# Patient Record
Sex: Male | Born: 1995 | Race: White | Hispanic: No | Marital: Single | Smoking: Current every day smoker
Health system: Southern US, Community
[De-identification: ages and names within clinical notes are randomized; demographics above are authoritative.]

## PROBLEM LIST (undated history)

## (undated) DIAGNOSIS — Z720 Tobacco use: Secondary | ICD-10-CM

## (undated) DIAGNOSIS — J45909 Unspecified asthma, uncomplicated: Secondary | ICD-10-CM

## (undated) DIAGNOSIS — F1290 Cannabis use, unspecified, uncomplicated: Secondary | ICD-10-CM

---
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
## 2015-08-25 ENCOUNTER — Emergency Department (HOSPITAL_COMMUNITY): Payer: Medicaid Other

## 2015-08-25 ENCOUNTER — Observation Stay (HOSPITAL_COMMUNITY)
Admission: EM | Admit: 2015-08-25 | Discharge: 2015-08-26 | Disposition: A | Discharge status: Home / Self Care | Payer: Medicaid Other | Attending: Emergency Medicine | Admitting: Emergency Medicine

## 2015-08-25 ENCOUNTER — Encounter (HOSPITAL_COMMUNITY): Payer: Self-pay | Admitting: Emergency Medicine

## 2015-08-25 DIAGNOSIS — A419 Sepsis, unspecified organism: Secondary | ICD-10-CM | POA: Diagnosis not present

## 2015-08-25 DIAGNOSIS — J209 Acute bronchitis, unspecified: Secondary | ICD-10-CM | POA: Diagnosis present

## 2015-08-25 DIAGNOSIS — J45901 Unspecified asthma with (acute) exacerbation: Secondary | ICD-10-CM | POA: Diagnosis not present

## 2015-08-25 DIAGNOSIS — R509 Fever, unspecified: Secondary | ICD-10-CM | POA: Diagnosis present

## 2015-08-25 DIAGNOSIS — R000 Tachycardia, unspecified: Secondary | ICD-10-CM | POA: Diagnosis not present

## 2015-08-25 DIAGNOSIS — F1290 Cannabis use, unspecified, uncomplicated: Secondary | ICD-10-CM | POA: Diagnosis present

## 2015-08-25 DIAGNOSIS — F17210 Nicotine dependence, cigarettes, uncomplicated: Secondary | ICD-10-CM | POA: Insufficient documentation

## 2015-08-25 DIAGNOSIS — R0789 Other chest pain: Secondary | ICD-10-CM | POA: Insufficient documentation

## 2015-08-25 DIAGNOSIS — Z720 Tobacco use: Secondary | ICD-10-CM | POA: Diagnosis present

## 2015-08-25 DIAGNOSIS — R062 Wheezing: Secondary | ICD-10-CM

## 2015-08-25 HISTORY — DX: Unspecified asthma, uncomplicated: J45.909

## 2015-08-25 HISTORY — DX: Tobacco use: Z72.0

## 2015-08-25 HISTORY — DX: Cannabis use, unspecified, uncomplicated: F12.90

## 2015-08-25 LAB — I-STAT CG4 LACTIC ACID, ED: Lactic Acid, Venous: 1.53 mmol/L (ref 0.5–2.0)

## 2015-08-25 LAB — I-STAT TROPONIN, ED: Troponin i, poc: 0.05 ng/mL (ref 0.00–0.08)

## 2015-08-25 LAB — CBC WITH DIFFERENTIAL/PLATELET
BASOS ABS: 0 10*3/uL (ref 0.0–0.1)
BASOS PCT: 0 %
Eosinophils Absolute: 0 10*3/uL (ref 0.0–0.7)
Eosinophils Relative: 0 %
HEMATOCRIT: 45.3 % (ref 39.0–52.0)
HEMOGLOBIN: 15.5 g/dL (ref 13.0–17.0)
Lymphocytes Relative: 7 %
Lymphs Abs: 1.4 10*3/uL (ref 0.7–4.0)
MCH: 31.9 pg (ref 26.0–34.0)
MCHC: 34.2 g/dL (ref 30.0–36.0)
MCV: 93.2 fL (ref 78.0–100.0)
MONOS PCT: 12 %
Monocytes Absolute: 2.4 10*3/uL — ABNORMAL HIGH (ref 0.1–1.0)
NEUTROS ABS: 16.3 10*3/uL — AB (ref 1.7–7.7)
Neutrophils Relative %: 81 %
PLATELETS: 219 10*3/uL (ref 150–400)
RBC: 4.86 MIL/uL (ref 4.22–5.81)
RDW: 12.5 % (ref 11.5–15.5)
WBC: 20.2 10*3/uL — ABNORMAL HIGH (ref 4.0–10.5)

## 2015-08-25 MED ORDER — SODIUM CHLORIDE 0.9 % IV BOLUS (SEPSIS)
500.0000 mL | Freq: Once | INTRAVENOUS | Status: AC
  Administered 2015-08-26: 500 mL via INTRAVENOUS

## 2015-08-25 MED ORDER — SODIUM CHLORIDE 0.9 % IV BOLUS (SEPSIS)
250.0000 mL | Freq: Once | INTRAVENOUS | Status: AC
  Administered 2015-08-26: 250 mL via INTRAVENOUS

## 2015-08-25 MED ORDER — SODIUM CHLORIDE 0.9 % IV BOLUS (SEPSIS)
1000.0000 mL | Freq: Once | INTRAVENOUS | Status: AC
  Administered 2015-08-25: 1000 mL via INTRAVENOUS

## 2015-08-25 MED ORDER — ACETAMINOPHEN 325 MG PO TABS
650.0000 mg | ORAL_TABLET | Freq: Once | ORAL | Status: AC | PRN
  Administered 2015-08-25: 650 mg via ORAL
  Filled 2015-08-25: qty 2

## 2015-08-25 NOTE — ED Notes (Signed)
C/o pain to center of chest, productive cough with green sputum, and fever since Monday.

## 2015-08-25 NOTE — ED Provider Notes (Signed)
CSN: [id number]     Arrival date & time 08/25/15  2228 History   First MD Initiated Contact with Patient 08/25/15 2250     Chief Complaint  Patient presents with  . Code Sepsis     (Consider location/radiation/quality/duration/timing/severity/associated sxs/prior Treatment) The history is provided by the patient and medical records. No language interpreter was used.     Brendan Fields is a 20 y.o. male  with a hx of Childhood asthma presents to the Emergency Department complaining of gradual, persistent, progressively worsening cough active of green sputum, shortness of breath, central chest pain onset days ago. Patient reports he developed fever today.  He reports he smokes one block and mild cigar per day. Admits to intermittent alcohol use and marijuana several times per week but denies other drug usage. Patient specifically denies IV drug usage. He denies sick contacts. Denies recent travel. Did not receive an Influenza vaccine this year. No treatments prior to arrival. Nothing makes the symptoms better or worse.  Patient reports associated nausea without vomiting. He denies headache, neck pain, neck stiffness, abdominal pain, numbing, diarrhea, syncope, dysuria, hematuria, rash.  Past Medical History  Diagnosis Date  . Tobacco abuse   . Marijuana use   . Childhood asthma    History reviewed. No pertinent past surgical history. No family history on file. Social History  Substance Use Topics  . Smoking status: Current Every Day Smoker    Types: Cigars  . Smokeless tobacco: None  . Alcohol Use: Yes    Review of Systems  Constitutional: Positive for fever and fatigue. Negative for diaphoresis, appetite change and unexpected weight change.  HENT: Negative for mouth sores.   Eyes: Negative for visual disturbance.  Respiratory: Positive for cough, chest tightness, shortness of breath and wheezing.   Cardiovascular: Negative for chest pain.  Gastrointestinal: Negative for nausea,  vomiting, abdominal pain, diarrhea and constipation.  Endocrine: Negative for polydipsia, polyphagia and polyuria.  Genitourinary: Negative for dysuria, urgency, frequency and hematuria.  Musculoskeletal: Negative for back pain and neck stiffness.  Skin: Negative for rash.  Allergic/Immunologic: Negative for immunocompromised state.  Neurological: Positive for weakness ( generalized). Negative for syncope, light-headedness and headaches.  Hematological: Does not bruise/bleed easily.  Psychiatric/Behavioral: Negative for sleep disturbance. The patient is not nervous/anxious.       Allergies  Review of patient's allergies indicates no known allergies.  Home Medications   Prior to Admission medications   Medication Sig Start Date End Date Taking? Authorizing Provider  azithromycin (ZITHROMAX) 250 MG tablet 1 tab po qd x 4 more days Patient not taking: Reported on 03/29/2014 08/11/11   Viviano Simas, NP  naproxen (NAPROSYN) 250 MG tablet Take 1 tablet (250 mg total) by mouth 2 (two) times daily with a meal. Patient not taking: Reported on 08/25/2015 08/29/14   [name], PA-C  predniSONE (DELTASONE) 20 MG tablet Take 2 tablets (40 mg total) by mouth daily. Patient not taking: Reported on 08/25/2015 08/29/14   [name], PA-C   BP 115/72 mmHg  Pulse 117  Temp(Src) 103.1 F (39.5 C) (Oral)  Resp 16  Ht  (1.803 m)  Wt 55.792 kg  BMI 17.16 kg/m2  SpO2 97% Physical Exam  Constitutional: He is oriented to person, place, and time. He appears well-developed and well-nourished. No distress.  Awake, alert, nontoxic appearance  HENT:  Head: Normocephalic and atraumatic.  Right Ear: Tympanic membrane, external ear and ear canal normal.  Left Ear: Tympanic membrane, external ear and ear  canal normal.  Nose: Nose normal. No epistaxis. Right sinus exhibits no maxillary sinus tenderness and no frontal sinus tenderness. Left sinus exhibits no maxillary sinus tenderness and no frontal  sinus tenderness.  Mouth/Throat: Uvula is midline, oropharynx is clear and moist and mucous membranes are normal. Mucous membranes are not pale and not cyanotic. No oropharyngeal exudate, posterior oropharyngeal edema, posterior oropharyngeal erythema or tonsillar abscesses.  Eyes: Conjunctivae are normal. Pupils are equal, round, and reactive to light. No scleral icterus.  Neck: Normal range of motion and full passive range of motion without pain. Neck supple.  Cardiovascular: Regular rhythm, normal heart sounds and intact distal pulses.  Tachycardia present.   No murmur heard. Pulses:      Radial pulses are 2+ on the right side, and 2+ on the left side.       Dorsalis pedis pulses are 2+ on the right side, and 2+ on the left side.  Capillary refill less than 3 seconds  Pulmonary/Chest: Effort normal and breath sounds normal. No stridor. No respiratory distress. He has no wheezes.  Equal chest expansion Diminished breath sounds with wheezes throughout  Abdominal: Soft. Bowel sounds are normal. He exhibits no mass. There is no tenderness. There is no rebound and no guarding.  Musculoskeletal: Normal range of motion. He exhibits no edema.  Lymphadenopathy:    He has no cervical adenopathy.  Neurological: He is alert and oriented to person, place, and time.  Speech is clear and goal oriented Moves extremities without ataxia  Skin: Skin is warm and dry. No rash noted. He is not diaphoretic.  No track marks on arms or legs Hot to touch No rash or signs of cellulitis  Psychiatric: He has a normal mood and affect.  Nursing note and vitals reviewed.   ED Course  Procedures (including critical care time) Labs Review Labs Reviewed  COMPREHENSIVE METABOLIC PANEL - Abnormal; Notable for the following:    CO2 18 (*)    Glucose, Bld 107 (*)    ALT 15 (*)    Total Bilirubin 1.5 (*)    All other components within normal limits  CBC WITH DIFFERENTIAL/PLATELET - Abnormal; Notable for the  following:    WBC 20.2 (*)    Neutro Abs 16.3 (*)    Monocytes Absolute 2.4 (*)    All other components within normal limits  URINALYSIS, ROUTINE W REFLEX MICROSCOPIC (NOT AT [facility]) - Abnormal; Notable for the following:    Color, Urine AMBER (*)    pH 8.5 (*)    Ketones, ur >80 (*)    Protein, ur 100 (*)    All other components within normal limits  URINE MICROSCOPIC-ADD ON - Abnormal; Notable for the following:    Squamous Epithelial / LPF 0-5 (*)    Bacteria, UA RARE (*)    All other components within normal limits  I-STAT ARTERIAL BLOOD GAS, ED - Abnormal; Notable for the following:    pH, Arterial 7.345 (*)    pCO2 arterial 32.9 (*)    pO2, Arterial 78.0 (*)    Bicarbonate 17.5 (*)    Acid-base deficit 6.0 (*)    All other components within normal limits  CULTURE, BLOOD (ROUTINE X 2)  CULTURE, BLOOD (ROUTINE X 2)  URINE CULTURE  INFLUENZA PANEL BY PCR (TYPE A & B, H1N1)  I-STAT CG4 LACTIC ACID, ED  I-STAT TROPOININ, ED  I-STAT CG4 LACTIC ACID, ED    Imaging Review Dg Chest 2 View  08/25/2015  CLINICAL DATA:  Cough and fever for 1 week.  Midchest pain tonight. EXAM: CHEST  2 VIEW COMPARISON:  08/29/2014 FINDINGS: The lungs are clear. The pulmonary vasculature is normal. Heart size is normal. Hilar and mediastinal contours are unremarkable. There is no pleural effusion. IMPRESSION: No active cardiopulmonary disease. Electronically Signed   By: Ellery Plunk M.D.   On: 08/25/2015 23:18   I have personally reviewed and evaluated these images and lab results as part of my medical decision-making.   EKG Interpretation   Date/Time:  Sunday Aug 25 2015 22:34:54 EDT Ventricular Rate:  128 PR Interval:  120 QRS Duration: 82 QT Interval:  300 QTC Calculation: 438 R Axis:   -132 Text Interpretation:  Sinus tachycardia Right atrial enlargement Right  superior axis deviation Pulmonary disease pattern Abnormal ECG Confirmed  by RAY MD, Duwayne Heck ([id number]) on 08/26/2015 12:13:48  AM    CRITICAL CARE Performed by: Dierdre Forth Total critical care time: 35 minutes Critical care time was exclusive of separately billable procedures and treating other patients. Critical care was necessary to treat or prevent imminent or life-threatening deterioration. Critical care was time spent personally by me on the following activities: development of treatment plan with patient and/or surrogate as well as nursing, discussions with consultants, evaluation of patient's response to treatment, examination of patient, obtaining history from patient or surrogate, ordering and performing treatments and interventions, ordering and review of laboratory studies, ordering and review of radiographic studies, pulse oximetry and re-evaluation of patient's condition.  Sepsis - Repeat Assessment  Performed at:    08/26/2015 3:45 AM  Vitals     Blood pressure 115/72, pulse 117, temperature 103.1 F (39.5 C), temperature source Oral, resp. rate 16, height 5\' 11"  (1.803 m), weight 55.792 kg, SpO2 97 %.  Heart:     Tachycardic  Lungs:    Wheezing and Rhonchi  Capillary Refill:   <2 sec  Peripheral Pulse:   Radial pulse palpable  Skin:     Normal Color   MDM   Final diagnoses:  Sepsis, due to unspecified organism (HCC)  Wheezing  Fever, unspecified fever cause   Brendan Fields presents with Fever, tachycardia, mild hypoxia. Reports one week of chest pain shortness of breath. On exam wheezing which improved some after albuterol treatment. Patient meets sepsis criteria.  Fluids and antibiotics initiated. No evidence of urinary tract infection.  No infiltrate on chest x-ray however symptoms are consistent with pneumonia. Patient treated for same. CBC was significant leukocytosis of 20.2.  Normal lactic acid. Arterial blood gas with mild acidosis and some hypoxia.  Patient remains tachycardic even after fluid bolus however he is without hypotension. He is not requiring supplemental  oxygen.  Influenza panel pending. Will admit for further workup.  3:47 AM Discussed with Dr. Clyde Lundborg who will admit to Tele Obs.     The patient was discussed with and seen by Dr. Blinda Leatherwood who agrees with the treatment plan.    Brendan Client Jannette Cotham, PA-C 08/26/15 8119  Gilda Crease, MD 08/26/15 [phone]

## 2015-08-26 ENCOUNTER — Encounter (HOSPITAL_COMMUNITY): Payer: Self-pay | Admitting: Internal Medicine

## 2015-08-26 ENCOUNTER — Telehealth: Payer: Self-pay

## 2015-08-26 DIAGNOSIS — J209 Acute bronchitis, unspecified: Secondary | ICD-10-CM

## 2015-08-26 DIAGNOSIS — A419 Sepsis, unspecified organism: Secondary | ICD-10-CM | POA: Diagnosis present

## 2015-08-26 DIAGNOSIS — F1290 Cannabis use, unspecified, uncomplicated: Secondary | ICD-10-CM | POA: Diagnosis present

## 2015-08-26 DIAGNOSIS — Z720 Tobacco use: Secondary | ICD-10-CM | POA: Diagnosis present

## 2015-08-26 DIAGNOSIS — F1210 Cannabis abuse, uncomplicated: Secondary | ICD-10-CM

## 2015-08-26 LAB — COMPREHENSIVE METABOLIC PANEL
ALBUMIN: 4.5 g/dL (ref 3.5–5.0)
ALT: 15 U/L — AB (ref 17–63)
ANION GAP: 14 (ref 5–15)
AST: 22 U/L (ref 15–41)
Alkaline Phosphatase: 79 U/L (ref 38–126)
BUN: 7 mg/dL (ref 6–20)
CHLORIDE: 104 mmol/L (ref 101–111)
CO2: 18 mmol/L — AB (ref 22–32)
CREATININE: 1.16 mg/dL (ref 0.61–1.24)
Calcium: 9.2 mg/dL (ref 8.9–10.3)
GFR calc non Af Amer: >60 mL/min (ref >60)
Glucose, Bld: 107 mg/dL — ABNORMAL HIGH (ref 65–99)
Potassium: 3.9 mmol/L (ref 3.5–5.1)
SODIUM: 136 mmol/L (ref 135–145)
Total Bilirubin: 1.5 mg/dL — ABNORMAL HIGH (ref 0.3–1.2)
Total Protein: 7.6 g/dL (ref 6.5–8.1)

## 2015-08-26 LAB — INFLUENZA PANEL BY PCR (TYPE A & B)
H1N1FLUPCR: NOT DETECTED
INFLAPCR: NEGATIVE
Influenza B By PCR: NEGATIVE

## 2015-08-26 LAB — I-STAT ARTERIAL BLOOD GAS, ED
ACID-BASE DEFICIT: 6 mmol/L — AB (ref 0.0–2.0)
Bicarbonate: 17.5 mEq/L — ABNORMAL LOW (ref 20.0–24.0)
O2 SAT: 93 %
PH ART: 7.345 — AB (ref 7.350–7.450)
Patient temperature: 103.1
TCO2: 18 mmol/L (ref 0–100)
pCO2 arterial: 32.9 mmHg — ABNORMAL LOW (ref 35.0–45.0)
pO2, Arterial: 78 mmHg — ABNORMAL LOW (ref 80.0–100.0)

## 2015-08-26 LAB — RAPID URINE DRUG SCREEN, HOSP PERFORMED
Amphetamines: NOT DETECTED
BENZODIAZEPINES: NOT DETECTED
Barbiturates: NOT DETECTED
COCAINE: POSITIVE — AB
Opiates: NOT DETECTED
Tetrahydrocannabinol: POSITIVE — AB

## 2015-08-26 LAB — RESPIRATORY PANEL BY PCR
ADENOVIRUS-RVPPCR: NOT DETECTED
Bordetella pertussis: NOT DETECTED
CHLAMYDOPHILA PNEUMONIAE-RVPPCR: NOT DETECTED
CORONAVIRUS 229E-RVPPCR: NOT DETECTED
Coronavirus HKU1: NOT DETECTED
Coronavirus NL63: NOT DETECTED
Coronavirus OC43: NOT DETECTED
INFLUENZA A H1-RVPPCR: NOT DETECTED
INFLUENZA A-RVPPCR: NOT DETECTED
Influenza A H1 2009: NOT DETECTED
Influenza A H3: NOT DETECTED
Influenza B: NOT DETECTED
Metapneumovirus: NOT DETECTED
Mycoplasma pneumoniae: NOT DETECTED
PARAINFLUENZA VIRUS 4-RVPPCR: NOT DETECTED
Parainfluenza Virus 1: NOT DETECTED
Parainfluenza Virus 2: NOT DETECTED
Parainfluenza Virus 3: NOT DETECTED
RESPIRATORY SYNCYTIAL VIRUS-RVPPCR: NOT DETECTED
Rhinovirus / Enterovirus: DETECTED — AB

## 2015-08-26 LAB — URINALYSIS, ROUTINE W REFLEX MICROSCOPIC
BILIRUBIN URINE: NEGATIVE
GLUCOSE, UA: NEGATIVE mg/dL
Hgb urine dipstick: NEGATIVE
LEUKOCYTES UA: NEGATIVE
NITRITE: NEGATIVE
PROTEIN: 100 mg/dL — AB
Specific Gravity, Urine: 1.028 (ref 1.005–1.030)
pH: 8.5 — ABNORMAL HIGH (ref 5.0–8.0)

## 2015-08-26 LAB — I-STAT CG4 LACTIC ACID, ED: LACTIC ACID, VENOUS: 1.01 mmol/L (ref 0.5–2.0)

## 2015-08-26 LAB — HIV ANTIBODY (ROUTINE TESTING W REFLEX): HIV SCREEN 4TH GENERATION: NONREACTIVE

## 2015-08-26 LAB — STREP PNEUMONIAE URINARY ANTIGEN: Strep Pneumo Urinary Antigen: NEGATIVE

## 2015-08-26 LAB — URINE MICROSCOPIC-ADD ON

## 2015-08-26 MED ORDER — IPRATROPIUM BROMIDE 0.02 % IN SOLN
0.5000 mg | Freq: Two times a day (BID) | RESPIRATORY_TRACT | Status: DC
  Administered 2015-08-26: 0.5 mg via RESPIRATORY_TRACT
  Filled 2015-08-26 (×2): qty 2.5

## 2015-08-26 MED ORDER — ALBUTEROL SULFATE (2.5 MG/3ML) 0.083% IN NEBU
5.0000 mg | INHALATION_SOLUTION | Freq: Once | RESPIRATORY_TRACT | Status: AC
  Administered 2015-08-26: 5 mg via RESPIRATORY_TRACT
  Filled 2015-08-26: qty 6

## 2015-08-26 MED ORDER — DM-GUAIFENESIN ER 30-600 MG PO TB12
1.0000 | ORAL_TABLET | Freq: Two times a day (BID) | ORAL | Status: DC
  Administered 2015-08-26: 1 via ORAL
  Filled 2015-08-26: qty 1

## 2015-08-26 MED ORDER — NICOTINE 21 MG/24HR TD PT24
21.0000 mg | MEDICATED_PATCH | Freq: Every day | TRANSDERMAL | Status: DC
  Filled 2015-08-26: qty 1

## 2015-08-26 MED ORDER — ALBUTEROL SULFATE HFA 108 (90 BASE) MCG/ACT IN AERS: 1.0000 | INHALATION_SPRAY | RESPIRATORY_TRACT | Status: DC | PRN

## 2015-08-26 MED ORDER — LEVALBUTEROL HCL 1.25 MG/0.5ML IN NEBU
1.2500 mg | INHALATION_SOLUTION | Freq: Two times a day (BID) | RESPIRATORY_TRACT | Status: DC
  Administered 2015-08-26: 1.25 mg via RESPIRATORY_TRACT
  Filled 2015-08-26 (×2): qty 0.5

## 2015-08-26 MED ORDER — ENOXAPARIN SODIUM 40 MG/0.4ML ~~LOC~~ SOLN: 40.0000 mg | SUBCUTANEOUS | Status: DC

## 2015-08-26 MED ORDER — DEXTROSE 5 % IV SOLN
500.0000 mg | INTRAVENOUS | Status: DC
  Filled 2015-08-26: qty 500

## 2015-08-26 MED ORDER — ALBUTEROL SULFATE (2.5 MG/3ML) 0.083% IN NEBU: 2.5000 mg | INHALATION_SOLUTION | RESPIRATORY_TRACT | Status: DC | PRN

## 2015-08-26 MED ORDER — CEFTRIAXONE SODIUM 1 G IJ SOLR
1.0000 g | Freq: Once | INTRAMUSCULAR | Status: AC
  Administered 2015-08-26: 1 g via INTRAVENOUS
  Filled 2015-08-26: qty 10

## 2015-08-26 MED ORDER — ENOXAPARIN SODIUM 40 MG/0.4ML ~~LOC~~ SOLN
40.0000 mg | SUBCUTANEOUS | Status: DC
  Filled 2015-08-26: qty 0.4

## 2015-08-26 MED ORDER — SODIUM CHLORIDE 0.9 % IV SOLN
INTRAVENOUS | Status: DC
  Administered 2015-08-26: 07:00:00 via INTRAVENOUS

## 2015-08-26 MED ORDER — AZITHROMYCIN 500 MG IV SOLR
500.0000 mg | Freq: Once | INTRAVENOUS | Status: AC
  Administered 2015-08-26: 500 mg via INTRAVENOUS
  Filled 2015-08-26: qty 500

## 2015-08-26 MED ORDER — IPRATROPIUM BROMIDE 0.02 % IN SOLN
0.5000 mg | Freq: Once | RESPIRATORY_TRACT | Status: AC
  Administered 2015-08-26: 0.5 mg via RESPIRATORY_TRACT
  Filled 2015-08-26: qty 2.5

## 2015-08-26 MED ORDER — LEVALBUTEROL HCL 1.25 MG/0.5ML IN NEBU: 1.2500 mg | INHALATION_SOLUTION | RESPIRATORY_TRACT | Status: DC | PRN

## 2015-08-26 MED ORDER — IPRATROPIUM BROMIDE 0.02 % IN SOLN
0.5000 mg | RESPIRATORY_TRACT | Status: DC
  Filled 2015-08-26: qty 2.5

## 2015-08-26 MED ORDER — PREDNISONE 20 MG PO TABS: 40.0000 mg | ORAL_TABLET | Freq: Every day | ORAL | Status: AC

## 2015-08-26 MED ORDER — DM-GUAIFENESIN ER 30-600 MG PO TB12: 1.0000 | ORAL_TABLET | Freq: Two times a day (BID) | ORAL | Status: AC

## 2015-08-26 MED ORDER — DEXTROSE 5 % IV SOLN
1.0000 g | INTRAVENOUS | Status: DC
  Filled 2015-08-26: qty 10

## 2015-08-26 MED ORDER — METHYLPREDNISOLONE SODIUM SUCC 125 MG IJ SOLR
60.0000 mg | Freq: Two times a day (BID) | INTRAMUSCULAR | Status: DC
  Administered 2015-08-26: 60 mg via INTRAVENOUS
  Filled 2015-08-26: qty 2

## 2015-08-26 MED ORDER — LEVALBUTEROL HCL 1.25 MG/0.5ML IN NEBU
1.2500 mg | INHALATION_SOLUTION | Freq: Four times a day (QID) | RESPIRATORY_TRACT | Status: DC
  Filled 2015-08-26: qty 0.5

## 2015-08-26 MED ORDER — LEVOFLOXACIN 500 MG PO TABS: 500.0000 mg | ORAL_TABLET | Freq: Every day | ORAL | Status: AC

## 2015-08-26 MED ORDER — NICOTINE 21 MG/24HR TD PT24: 21.0000 mg | MEDICATED_PATCH | Freq: Every day | TRANSDERMAL | Status: AC

## 2015-08-26 MED ORDER — ALBUTEROL SULFATE HFA 108 (90 BASE) MCG/ACT IN AERS: 2.0000 | INHALATION_SPRAY | Freq: Four times a day (QID) | RESPIRATORY_TRACT | Status: AC | PRN

## 2015-08-26 MED FILL — !PROVENTIL HFA 90 MCG INH: 108 (90 BAS | 30 days supply | Qty: 1 | Fill #0

## 2015-08-26 MED FILL — levoFLOXacin 500 MG TABS: 500 | 4 days supply | Qty: 4 | Fill #0

## 2015-08-26 MED FILL — predniSONE 20 MG TABS: 20 | 6 days supply | Qty: 8 | Fill #0

## 2015-08-26 NOTE — Telephone Encounter (Signed)
Call received from Isidoro DonningAlesia Shavis, RN CM requesting a hospital follow up appointment for the patient. At this time there are not any appointments available. Will continue to follow.

## 2015-08-26 NOTE — Care Management Note (Addendum)
Case Management Note  Patient Details  Name: Brendan Fields MRN: [id number] Date of Birth: 05/22/1995  Subjective/Objective:       Acute bronchitis and sepsis             Action/Plan: Discharge Planning:   NCM spoke to pt and states he does not have insurance or PCP. NCM contacted [facility] Liaison, Erskine SquibbJane to arrange appt for follow up. Pt states he works full-time. Does not have a neb machine for home. Waiting final recommendations for home. Will assess medications cost to see if price will be reasonable for pt to pay out of pocket. [facility] once appt established.   1540 NCM contacted [facility] and they have medications in stock, will do copay waiver. [facility] Pt states he will not have income for the 3 days he missed from work and finances are limited. Arranged appt for [facility] for 09/09/2015 at 9:45 am. Provided pt with brochure for [facility] with directions to pick up meds and [facility] with directions and contact number. Explained to please call if he cannot make appt. Discussed smoking cessation to ensure ongoing wellness. Pt verbalized understanding how important it is to quit smoking.    Expected Discharge Date:  08/26/2015              Expected Discharge Plan:  Home/Self Care  In-House Referral:  NA  Discharge planning Services  CM Consult, Medication Assistance, Indigent Health Clinic  Post Acute Care Choice:  NA Choice offered to:  NA  DME Arranged:  N/A DME Agency:  NA  HH Arranged:  NA HH Agency:  NA  Status of Service:  complete Medicare Important Message Given:    Date Medicare IM Given:    Medicare IM give by:    Date Additional Medicare IM Given:    Additional Medicare Important Message give by:     If discussed at Long Length of Stay Meetings, dates discussed:    Additional Comments:  Elliot CousinShavis, Vonnie Ligman Ellen, RN 08/26/2015, 12:37 PM

## 2015-08-26 NOTE — Progress Notes (Signed)
Pt discharged home per MD. NSL removed x 2 with caths intact. Pt showered prior to discharge. Reviewed discharge instructions discussion specifics for medication administration, including proper technique for inhaler, how to take prednisone taper, and to complete entire course of levofloxacin. Patient without insurance or PCP. Case manager arranged follow up appt at [facility] and [facility]. Patient ambulate out on his own per choice accompanied by family. Bess KindsGWALTNEY, Nieko Clarin B, RN

## 2015-08-26 NOTE — Discharge Summary (Signed)
Physician Discharge Summary  Brendan Fields ZOX:[id number] DOB: [birth year] DOA: 08/25/2015  PCP: No PCP Per Patient  Admit date: 08/25/2015 Discharge date: 08/26/2015  Time spent: > 30 minutes  Recommendations for Outpatient Follow-up:  1. Follow up with [facility] as below for new PCP    Discharge Diagnoses:  Principal Problem:   Acute bronchitis Active Problems:   Tobacco abuse   Marijuana use   Sepsis (HCC)  Discharge Condition: stable  Diet recommendation: regular  Filed Weights   08/26/15 0049 08/26/15 0433  Weight: 55.792 kg (123 lb) 57.6 kg (126 lb 15.8 oz)    History of present illness:  See H&P, Labs, Consult and Test reports for all details in brief, patient is a Brendan Fields is a 20 y.o. male with medical history significant of tobacco abuse, marijuana use, childhood asthma, who presents with cough, chest pain and fever.  Hospital Course:  Patient was admitted to the hospital with sepsis physiology in the setting of acute bronchitis. He responded to fluids and antibiotics and was afebrile, initial tachycardia resolved and was feeling back to baseline. He was able to ambulate in the hallway without dyspnea and was discharged home in stable condition to complete a course for URI with levaquin. He was wheezing on admission, this has resolved, will have a quick prednisone taper as well as he was prescribed an albuterol inhaler. He was counseled extensively regarding his polysubstance abuse. His respiratory panel was negative for influenza however positive for rhinovirus.   Procedures:  None    Consultations:  None   Discharge Exam: Filed Vitals:   08/26/15 0230 08/26/15 0353 08/26/15 0433 08/26/15 1020  BP:   127/68 112/62  Pulse: 117 96 93 79  Temp:  99.4 F (37.4 C) 98.7 F (37.1 C) 98.6 F (37 C)  TempSrc:  Oral Oral Oral  Resp: Height:    (1.803 m)   Weight:   57.6 kg (126 lb 15.8 oz)   SpO2: 97% 97% 97% 97%     General: NAD Cardiovascular: RRR Respiratory: CTA biL  Discharge Instructions Activity:  As tolerated   Get Medicines reviewed and adjusted: Please take all your medications with you for your next visit with your Primary MD  Please request your Primary MD to go over all hospital tests and procedure/radiological results at the follow up, please ask your Primary MD to get all Hospital records sent to his/her office.  If you experience worsening of your admission symptoms, develop shortness of breath, life threatening emergency, suicidal or homicidal thoughts you must seek medical attention immediately by calling 911 or calling your MD immediately if symptoms less severe.  You must read complete instructions/literature along with all the possible adverse reactions/side effects for all the Medicines you take and that have been prescribed to you. Take any new Medicines after you have completely understood and accpet all the possible adverse reactions/side effects.   Do not drive when taking Pain medications.   Do not take more than prescribed Pain, Sleep and Anxiety Medications  Special Instructions: If you have smoked or chewed Tobacco in the last 2 yrs please stop smoking, stop any regular Alcohol and or any Recreational drug use.  Wear Seat belts while driving.  Please note  You were cared for by a hospitalist during your hospital stay. Once you are discharged, your primary care physician will handle any further medical issues. Please note that NO REFILLS for any discharge medications  will be authorized once you are discharged, as it is imperative that you return to your primary care physician (or establish a relationship with a primary care physician if you do not have one) for your aftercare needs so that they can reassess your need for medications and monitor your lab values.    Medication List    STOP taking these medications        azithromycin 250 MG tablet  Commonly  known as:  ZITHROMAX     naproxen 250 MG tablet  Commonly known as:  NAPROSYN      TAKE these medications        albuterol 108 (90 Base) MCG/ACT inhaler  Commonly known as:  PROVENTIL HFA;VENTOLIN HFA  Inhale 2 puffs into the lungs every 6 (six) hours as needed for wheezing or shortness of breath.     dextromethorphan-guaiFENesin 30-600 MG 12hr tablet  Commonly known as:  MUCINEX DM  Take 1 tablet by mouth 2 (two) times daily.     levofloxacin 500 MG tablet  Commonly known as:  LEVAQUIN  Take 1 tablet (500 mg total) by mouth daily.     nicotine 21 mg/24hr patch  Commonly known as:  NICODERM CQ - dosed in mg/24 hours  Place 1 patch (21 mg total) onto the skin daily.     predniSONE 20 MG tablet  Commonly known as:  DELTASONE  Take 2 tablets (40 mg total) by mouth daily. 2 tabs daily x 2 days then 1 tab daily x 4 days then stop           Follow-up Information    Follow up with [facility].   Why:  appt scheduled for Sep 09, 2015 at 0945 am. please call to reschedule if you cannot make appt.    Contact information:   [address]  [place], [place] [address] [phone]      The results of significant diagnostics from this hospitalization (including imaging, microbiology, ancillary and laboratory) are listed below for reference.    Significant Diagnostic Studies: Dg Chest 2 View  08/25/2015  CLINICAL DATA:  Cough and fever for 1 week.  Midchest pain tonight. EXAM: CHEST  2 VIEW COMPARISON:  08/29/2014 FINDINGS: The lungs are clear. The pulmonary vasculature is normal. Heart size is normal. Hilar and mediastinal contours are unremarkable. There is no pleural effusion. IMPRESSION: No active cardiopulmonary disease. Electronically Signed   By: Ellery Plunk M.D.   On: 08/25/2015 23:18    Microbiology: Recent Results (from the past 240 hour(s))  Urine culture     Status: None (Preliminary result)   Collection Time: 08/25/15 11:43 PM  Result Value Ref  Range Status   Specimen Description URINE, CLEAN CATCH  Final   Special Requests NONE  Final   Culture NO GROWTH < 12 HOURS  Final   Report Status PENDING  Incomplete  Respiratory Panel by PCR     Status: Abnormal   Collection Time: 08/26/15  2:32 AM  Result Value Ref Range Status   Adenovirus NOT DETECTED NOT DETECTED Final   Coronavirus 229E NOT DETECTED NOT DETECTED Final   Coronavirus HKU1 NOT DETECTED NOT DETECTED Final   Coronavirus NL63 NOT DETECTED NOT DETECTED Final   Coronavirus OC43 NOT DETECTED NOT DETECTED Final   Metapneumovirus NOT DETECTED NOT DETECTED Final   Rhinovirus / Enterovirus DETECTED (A) NOT DETECTED Final   Influenza A NOT DETECTED NOT DETECTED Final   Influenza A H1 NOT DETECTED NOT  DETECTED Final   Influenza A H1 2009 NOT DETECTED NOT DETECTED Final   Influenza A H3 NOT DETECTED NOT DETECTED Final   Influenza B NOT DETECTED NOT DETECTED Final   Parainfluenza Virus 1 NOT DETECTED NOT DETECTED Final   Parainfluenza Virus 2 NOT DETECTED NOT DETECTED Final   Parainfluenza Virus 3 NOT DETECTED NOT DETECTED Final   Parainfluenza Virus 4 NOT DETECTED NOT DETECTED Final   Respiratory Syncytial Virus NOT DETECTED NOT DETECTED Final   Bordetella pertussis NOT DETECTED NOT DETECTED Final   Chlamydophila pneumoniae NOT DETECTED NOT DETECTED Final   Mycoplasma pneumoniae NOT DETECTED NOT DETECTED Final     Labs: Basic Metabolic Panel:  Recent Labs Lab 08/25/15 2255  NA 136  K 3.9  CL 104  CO2 18*  GLUCOSE 107*  BUN 7  CREATININE 1.16  CALCIUM 9.2   Liver Function Tests:  Recent Labs Lab 08/25/15 2255  AST 22  ALT 15*  ALKPHOS 79  BILITOT 1.5*  PROT 7.6  ALBUMIN 4.5   CBC:  Recent Labs Lab 08/25/15 2255  WBC 20.2*  NEUTROABS 16.3*  HGB 15.5  HCT 45.3  MCV 93.2  PLT 219     Signed:  Pernell Dikes  [facility] 08/26/2015, 5:48 PM

## 2015-08-26 NOTE — Progress Notes (Signed)
Pharmacy Antibiotic Note Brendan Fields is a 20 y.o. male admitted on 08/25/2015 with concern for sepsis 2/2 to CAP.  Pharmacy has been consulted for Ceftriaxone and azithromycin dosing.  Plan: 1. Ceftriaxone 1 gram IV q 24H 2. Azithromycin 500 mg IV q 24H 3. F/u clinical response and deescalate abx or change to oral agent when feasible   Height: 5\' 11"  (180.3 cm) IBW/kg (Calculated) : 75.3  Temp (24hrs), Avg:103.1 F (39.5 C), Min:103.1 F (39.5 C), Max:103.1 F (39.5 C)   Recent Labs Lab 08/25/15 2255 08/25/15 2322  WBC 20.2*  --   LATICACIDVEN  --  1.53    CrCl cannot be calculated (Unknown ideal weight.).    No Known Allergies  Antimicrobials this admission: 5/8 Ceftriaxone  >>  5/8 Azithromycin  >>   Dose adjustments this admission: n/a  Microbiology results: px  Thank you for allowing pharmacy to be a part of this patient's care.  Pollyann SamplesAndy Raequon Fields, PharmD, BCPS 08/26/2015, 12:08 AM Pager: [phone]

## 2015-08-26 NOTE — H&P (Signed)
History and Physical    Brendan Peppersnthony M Train YQM:[id number] DOB: 01/04/1996 DOA: 08/25/2015  Referring MD/NP/PA:   PCP: No PCP Per Patient   Outpatient Specialists: none Patient coming from:  Home   Chief Complaint: Cough, chest pain, fever  HPI: Brendan Fields is a 20 y.o. male with medical history significant of tobacco abuse, marijuana use, childhood asthma, who presents with cough, chest pain and fever.  Patient reports that he has been having cough and chest pain for about one week. He coughs up yellowish colored sputum. His chest pain is induced by coughing, located in the central chest, 8 out of 10 in severity, intermittent, sharp, nonradiating. He has mild SOB. He started having fever in this morning with temperature 103. He also has runny nose and sore throat. Patient does not have nausea, vomiting, abdominal pain, symptoms of UTI or unilateral weakness.  ED Course: pt was found to have WBC 20.2, temperature 103.1, tachycardia, tachypnea, lactate 1.53-->1.01, negative urinalysis, electrolytes and renal function okay. Chest x-ray did not show infiltration. Patient is placed on telemetry bed of observation.  Review of Systems:   General: has fevers, chills, no changes in body weight, has fatigue HEENT: no blurry vision, hearing changes or sore throat Pulm: has dyspnea, coughing, wheezing CV: has chest pain, no palpitations Abd: no nausea, vomiting, abdominal pain, diarrhea, constipation GU: no dysuria, burning on urination, increased urinary frequency, hematuria  Ext: no leg edema Neuro: no unilateral weakness, numbness, or tingling, no vision change or hearing loss Skin: no rash MSK: No muscle spasm, no deformity, no limitation of range of movement in spin Heme: No easy bruising.  Travel history: No recent long distant travel.  Allergy: No Known Allergies  Past Medical History  Diagnosis Date  . Tobacco abuse   . Marijuana use   . Childhood asthma     History  reviewed. No pertinent past surgical history.  Social History:  reports that he has been smoking Cigars.  He does not have any smokeless tobacco history on file. He reports that he drinks alcohol. He reports that he uses illicit drugs (Marijuana).  Family History:  Family History  Problem Relation Age of Onset  . Heart disease Mother     pt dose not know the detail     Prior to Admission medications   Medication Sig Start Date End Date Taking? Authorizing Provider  azithromycin (ZITHROMAX) 250 MG tablet 1 tab po qd x 4 more days Patient not taking: Reported on 03/29/2014 08/11/11   Viviano SimasLauren Robinson, NP  naproxen (NAPROSYN) 250 MG tablet Take 1 tablet (250 mg total) by mouth 2 (two) times daily with a meal. Patient not taking: Reported on 08/25/2015 08/29/14   Everlene FarrierWilliam Dansie, PA-C  predniSONE (DELTASONE) 20 MG tablet Take 2 tablets (40 mg total) by mouth daily. Patient not taking: Reported on 08/25/2015 08/29/14   Everlene FarrierWilliam Dansie, PA-C    Physical Exam: Filed Vitals:   08/26/15 0145 08/26/15 0200 08/26/15 0230 08/26/15 0353  BP: 132/66 115/72    Pulse: 109 113 117 96  Temp:    99.4 F (37.4 C)  TempSrc:    Oral  Resp: 20  16 22   Height:      Weight:      SpO2:  95% 97% 97%   General: Not in acute distress HEENT:       Eyes: PERRL, EOMI, no scleral icterus.       ENT: No discharge from the ears and nose, no pharynx injection,  no tonsillar enlargement.        Neck: No JVD, no bruit, no mass felt. Heme: No neck lymph node enlargement. Cardiac: S1/S2, RRR, No murmurs, No gallops or rubs. Pulm: has coarse breathing sound and mild wheezing bilaterally. No rales or rubs. Abd: Soft, nondistended, nontender, no rebound pain, no organomegaly, BS present. GU: No hematuria Ext: No pitting leg edema bilaterally. 2+DP/PT pulse bilaterally. Musculoskeletal: No joint deformities, No joint redness or warmth, no limitation of ROM in spin. Skin: No rashes.  Neuro: Alert, oriented X3, cranial nerves  II-XII grossly intact, moves all extremities normally. Psych: Patient is not psychotic, no suicidal or hemocidal ideation.  Labs on Admission: I have personally reviewed following labs and imaging studies  CBC:  Recent Labs Lab 08/25/15 2255  WBC 20.2*  NEUTROABS 16.3*  HGB 15.5  HCT 45.3  MCV 93.2  PLT 219   Basic Metabolic Panel:  Recent Labs Lab 08/25/15 2255  NA 136  K 3.9  CL 104  CO2 18*  GLUCOSE 107*  BUN 7  CREATININE 1.16  CALCIUM 9.2   GFR: Estimated Creatinine Clearance: 80.8 mL/min (by C-G formula based on Cr of 1.16). Liver Function Tests:  Recent Labs Lab 08/25/15 2255  AST 22  ALT 15*  ALKPHOS 79  BILITOT 1.5*  PROT 7.6  ALBUMIN 4.5   No results for input(s): LIPASE, AMYLASE in the last 168 hours. No results for input(s): AMMONIA in the last 168 hours. Coagulation Profile: No results for input(s): INR, PROTIME in the last 168 hours. Cardiac Enzymes: No results for input(s): CKTOTAL, CKMB, CKMBINDEX, TROPONINI in the last 168 hours. BNP (last 3 results) No results for input(s): PROBNP in the last 8760 hours. HbA1C: No results for input(s): HGBA1C in the last 72 hours. CBG: No results for input(s): GLUCAP in the last 168 hours. Lipid Profile: No results for input(s): CHOL, HDL, LDLCALC, TRIG, CHOLHDL, LDLDIRECT in the last 72 hours. Thyroid Function Tests: No results for input(s): TSH, T4TOTAL, FREET4, T3FREE, THYROIDAB in the last 72 hours. Anemia Panel: No results for input(s): VITAMINB12, FOLATE, FERRITIN, TIBC, IRON, RETICCTPCT in the last 72 hours. Urine analysis:    Component Value Date/Time   COLORURINE AMBER* 08/25/2015 2344   APPEARANCEUR CLEAR 08/25/2015 2344   LABSPEC 1.028 08/25/2015 2344   PHURINE 8.5* 08/25/2015 2344   GLUCOSEU NEGATIVE 08/25/2015 2344   HGBUR NEGATIVE 08/25/2015 2344   BILIRUBINUR NEGATIVE 08/25/2015 2344   KETONESUR >80* 08/25/2015 2344   PROTEINUR 100* 08/25/2015 2344   UROBILINOGEN 0.2  10/17/2014 2300   NITRITE NEGATIVE 08/25/2015 2344   LEUKOCYTESUR NEGATIVE 08/25/2015 2344   Sepsis Labs: @LABRCNTIP (procalcitonin:4,lacticidven:4) )No results found for this or any previous visit (from the past 240 hour(s)).   Radiological Exams on Admission: Dg Chest 2 View  08/25/2015  CLINICAL DATA:  Cough and fever for 1 week.  Midchest pain tonight. EXAM: CHEST  2 VIEW COMPARISON:  08/29/2014 FINDINGS: The lungs are clear. The pulmonary vasculature is normal. Heart size is normal. Hilar and mediastinal contours are unremarkable. There is no pleural effusion. IMPRESSION: No active cardiopulmonary disease. Electronically Signed   By: Ellery Plunk M.D.   On: 08/25/2015 23:18     EKG: Independently reviewed. QTC 438, tachycardia, left axis deviation  Assessment/Plan Principal Problem:   Acute bronchitis Active Problems:   Tobacco abuse   Marijuana use   Sepsis (HCC)   Acute bronchitis and sepsis: Patient's productive cough, chest pain, mild wheezing, fever are likely caused by acute bronchitis.  Early stage of pneumonia is also possible. Chest x-ray has no infiltration. Patient is septic with leukocytosis, fever, tachycardia and tachypnea on admission. Lactate is normal. Hemodynamically stable.  - Will place to tele bed for obs - IV Rocephin and azithromycin - Mucinex for cough  - Atrovent and Xopenex Neb prn for SOB - solumedrol 60 mg bid - Urine legionella and S. pneumococcal antigen - Follow up blood culture x2, sputum culture and respiratory virus panel, plus Flu pcr - will get Procalcitonin and trend lactic acid level per sepsis protocol - IVF: 2.5L of NS bolus in ED, followed by 100 mL per hour of NS  Tobacco abuse and Marijuana use: -did counseling about the importance of quitting substance use. -Nicotine patch -Check UDS and HIV antibody   DVT ppx: SQ Lovenox Code Status: Full code Family Communication: None at bed side.  Disposition Plan:  Anticipate  discharge back to previous home environment Consults called:  none Admission status: Obs / tele  Date of Service 08/26/2015    Lorretta Harp [facility] Pager [phone]  If 7PM-7AM, please contact night-coverage [email or web address] Password [email or web address] 08/26/2015, 4:03 AM

## 2015-08-27 ENCOUNTER — Telehealth: Payer: Self-pay

## 2015-08-27 LAB — URINE CULTURE: CULTURE: NO GROWTH

## 2015-08-27 NOTE — Telephone Encounter (Signed)
Confirmed with Isidoro DonningAlesia Shavis, RN CM that the patient has a hospital follow up appointment scheduled at the [facility].

## 2015-08-28 LAB — LEGIONELLA PNEUMOPHILA SEROGP 1 UR AG: L. PNEUMOPHILA SEROGP 1 UR AG: NEGATIVE

## 2015-08-31 LAB — CULTURE, BLOOD (ROUTINE X 2)
Culture: NO GROWTH
Culture: NO GROWTH

## 2015-09-09 ENCOUNTER — Ambulatory Visit: Payer: Self-pay | Admitting: Family Medicine

## 2016-04-27 ENCOUNTER — Emergency Department (HOSPITAL_COMMUNITY)
Admission: EM | Admit: 2016-04-27 | Discharge: 2016-04-27 | Discharge status: Against Medical Advice | Payer: Medicaid Other

## 2016-04-27 NOTE — ED Notes (Signed)
Called for Pt times 3. No Answer

## 2018-03-28 IMAGING — CR DG CHEST 2V
2 series · 2 of 2 positions shown · non-contrast
Comparison: 08/29/2014

CLINICAL DATA: Cough and fever for 1 week.  Midchest pain tonight.

EXAM:
CHEST  2 VIEW

[chest pa]
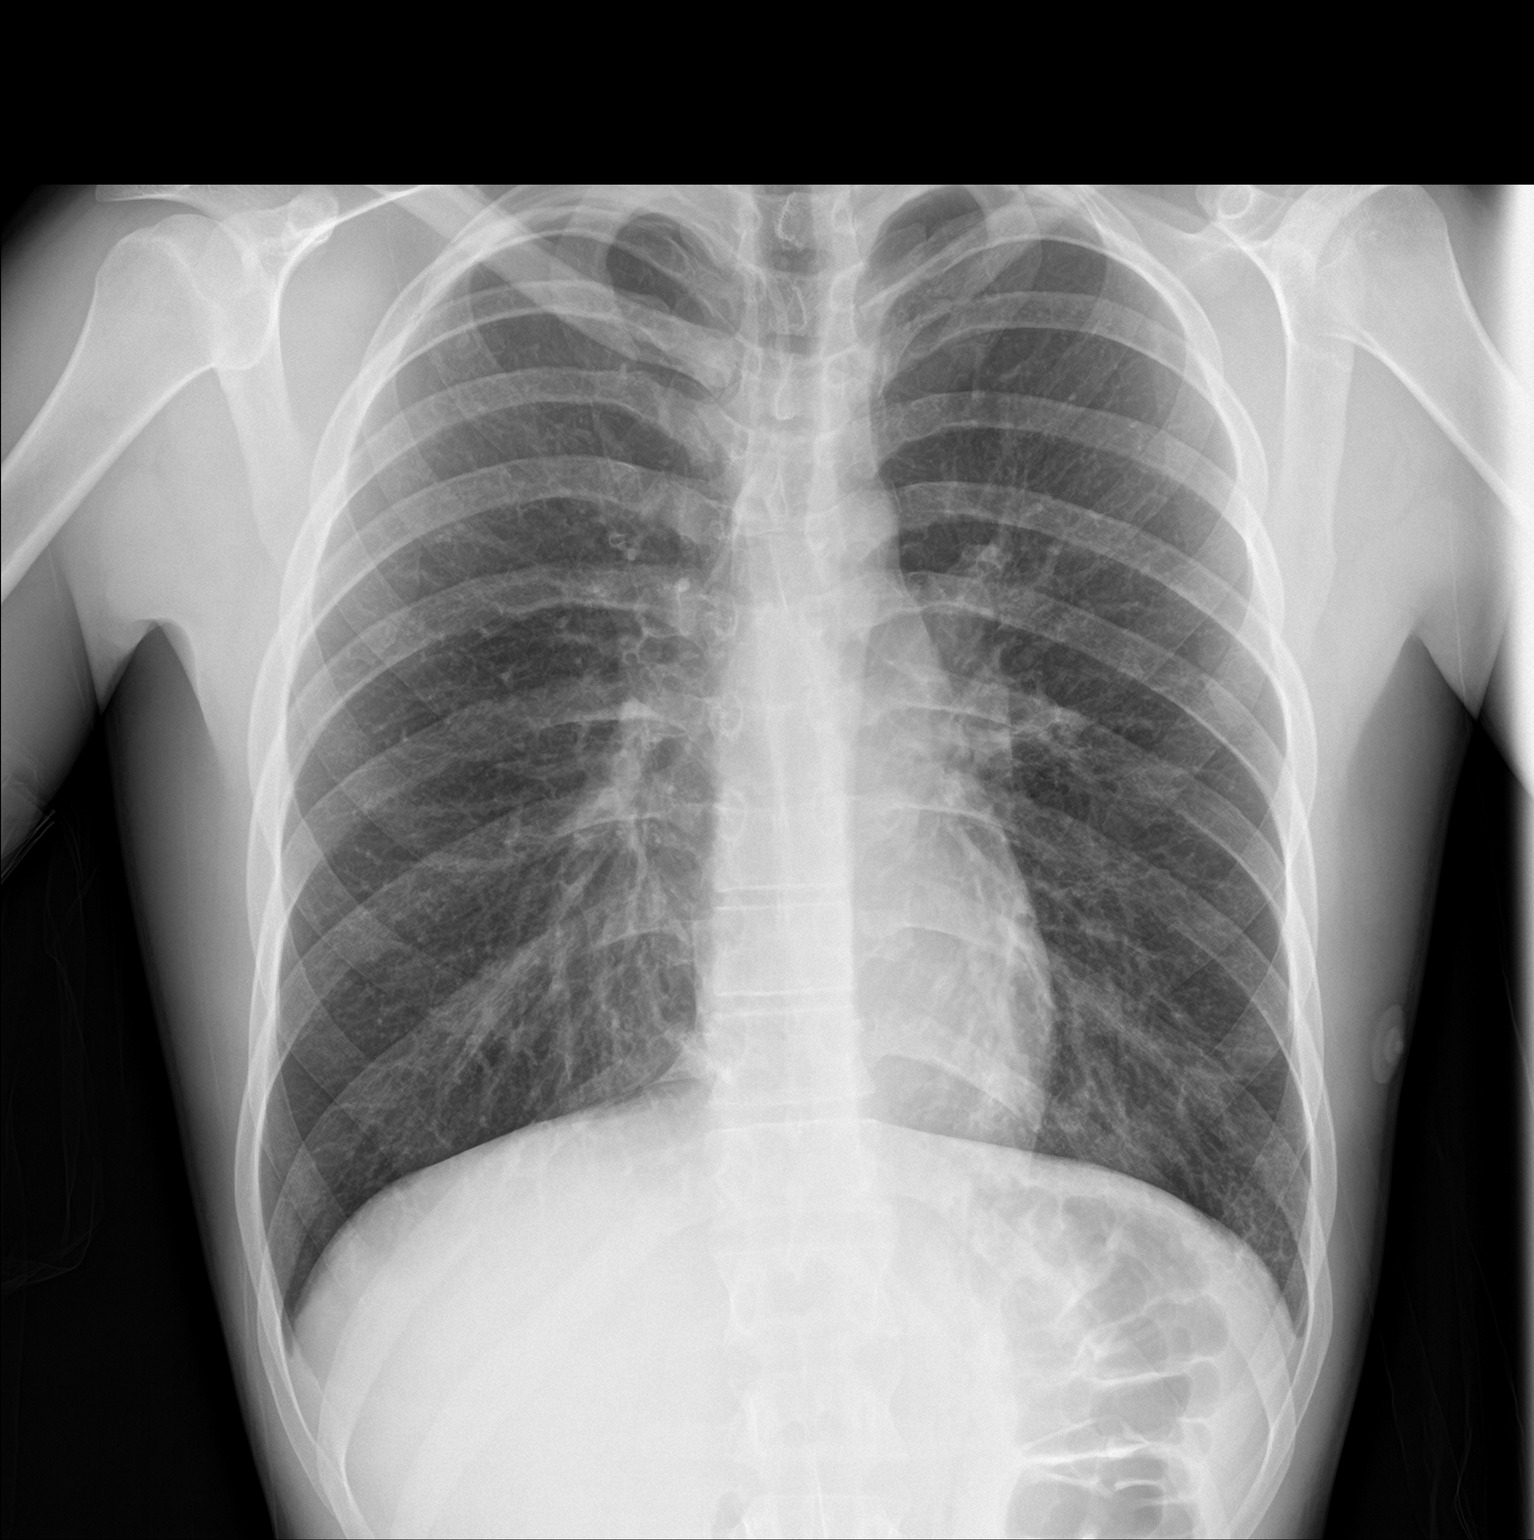

[chest lat]
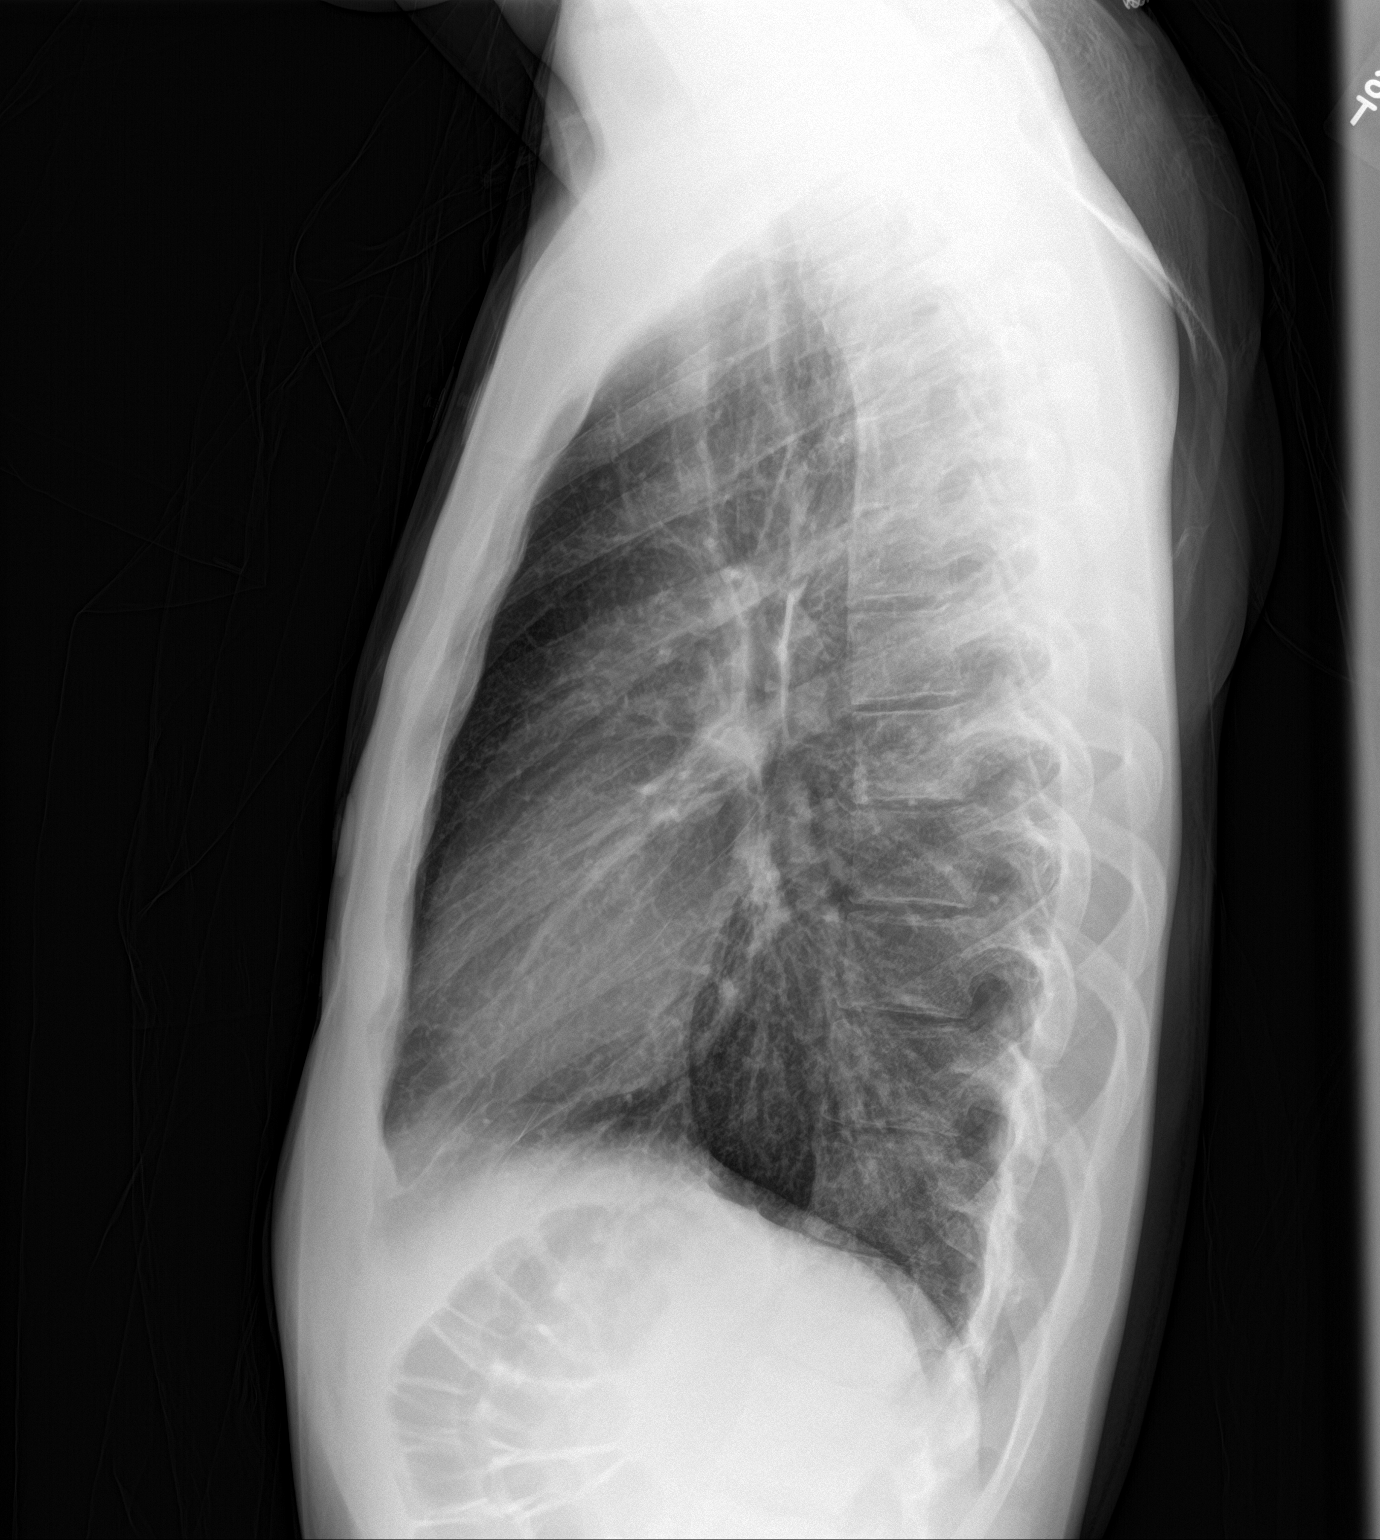

[2 of 2 positions shown; findings below may reference images not displayed]

FINDINGS: The lungs are clear. The pulmonary vasculature is normal. Heart size
is normal. Hilar and mediastinal contours are unremarkable. There is
no pleural effusion.
IMPRESSION: No active cardiopulmonary disease.

## 2020-07-30 ENCOUNTER — Encounter: Payer: Self-pay | Admitting: General Practice

## 2022-12-23 ENCOUNTER — Other Ambulatory Visit: Payer: Self-pay

## 2022-12-23 ENCOUNTER — Encounter (HOSPITAL_COMMUNITY): Payer: Self-pay

## 2022-12-23 ENCOUNTER — Emergency Department (HOSPITAL_COMMUNITY)
Admission: EM | Admit: 2022-12-23 | Discharge: 2022-12-23 | Disposition: A | Discharge status: Home / Self Care | Payer: Self-pay | Attending: Emergency Medicine | Admitting: Emergency Medicine

## 2022-12-23 DIAGNOSIS — F1210 Cannabis abuse, uncomplicated: Secondary | ICD-10-CM | POA: Insufficient documentation

## 2022-12-23 DIAGNOSIS — Z87891 Personal history of nicotine dependence: Secondary | ICD-10-CM | POA: Insufficient documentation

## 2022-12-23 DIAGNOSIS — K047 Periapical abscess without sinus: Secondary | ICD-10-CM | POA: Insufficient documentation

## 2022-12-23 MED ORDER — AMOXICILLIN-POT CLAVULANATE 875-125 MG PO TABS: 1.0000 | ORAL_TABLET | Freq: Two times a day (BID) | ORAL | 0 refills | Status: AC

## 2022-12-23 NOTE — ED Provider Notes (Signed)
[place] EMERGENCY DEPARTMENT AT [facility] Provider Note   CSN: [id number] Arrival date & time: 12/23/22  1315     History  Chief Complaint  Patient presents with   Dental Abcess.     Brendan Fields is a 27 y.o. male with past medical history of tobacco abuse, marijuana use presents to the ED complaining of a dental abscess on the lower right side of his mouth.  He states he first noticed it 3 days ago.  He has developed some facial swelling.  He is requesting antibiotics.  Patient is not established with a dentist.  Denies fever, sore throat, ear pain.       Home Medications Prior to Admission medications   Medication Sig Start Date End Date Taking? Authorizing Provider  amoxicillin-clavulanate (AUGMENTIN) 875-125 MG tablet Take 1 tablet by mouth every 12 (twelve) hours for 10 days. 12/23/22 01/02/23 Yes Jamy Cleckler R, PA-C  albuterol (PROVENTIL HFA;VENTOLIN HFA) 108 (90 Base) MCG/ACT inhaler Inhale 2 puffs into the lungs every 6 (six) hours as needed for wheezing or shortness of breath. 08/26/15   Leatha Gilding, MD  dextromethorphan-guaiFENesin (MUCINEX DM) 30-600 MG 12hr tablet Take 1 tablet by mouth 2 (two) times daily. 08/26/15   Leatha Gilding, MD  levofloxacin (LEVAQUIN) 500 MG tablet Take 1 tablet (500 mg total) by mouth daily. 08/26/15   Leatha Gilding, MD  nicotine (NICODERM CQ - DOSED IN MG/24 HOURS) 21 mg/24hr patch Place 1 patch (21 mg total) onto the skin daily. 08/26/15   Leatha Gilding, MD  predniSONE (DELTASONE) 20 MG tablet Take 2 tablets (40 mg total) by mouth daily. 2 tabs daily x 2 days then 1 tab daily x 4 days then stop 08/26/15   Leatha Gilding, MD      Allergies    Patient has no known allergies.    Review of Systems   Review of Systems  Constitutional:  Negative for fever.  HENT:  Positive for dental problem and facial swelling. Negative for ear pain and sore throat.     Physical Exam Updated Vital Signs BP 138/84 (BP Location:  Right Arm)   Pulse 88   Temp 98.5 F (36.9 C) (Oral)   Resp 20   Ht 5\' 11"  (1.803 m)   Wt 72.6 kg   SpO2 100%   BMI 22.32 kg/m  Physical Exam Vitals and nursing note reviewed.  Constitutional:      General: He is not in acute distress.    Appearance: Normal appearance. He is not ill-appearing or diaphoretic.  HENT:     Head:      Mouth/Throat:     Lips: Pink.     Mouth: Mucous membranes are moist. No oral lesions.     Dentition: Abnormal dentition. Dental tenderness, gingival swelling, dental caries and dental abscesses present.     Pharynx: Oropharynx is clear. Uvula midline.     Comments: Lower right molar broken to the gumline with gingival swelling and dental caries.  Concern for dental abscess.  Cardiovascular:     Rate and Rhythm: Normal rate and regular rhythm.  Pulmonary:     Effort: Pulmonary effort is normal.  Skin:    General: Skin is warm and dry.     Capillary Refill: Capillary refill takes less than 2 seconds.  Neurological:     Mental Status: He is alert. Mental status is at baseline.  Psychiatric:        Mood and Affect:  Mood normal.        Behavior: Behavior normal.     ED Results / Procedures / Treatments   Labs (all labs ordered are listed, but only abnormal results are displayed) Labs Reviewed - No data to display  EKG None  Radiology No results found.  Procedures Procedures    Medications Ordered in ED Medications - No data to display  ED Course/ Medical Decision Making/ A&P                                 Medical Decision Making  This patient presents to the ED with chief complaint(s) of lower right tooth pain and facial swelling. The complaint involves an extensive differential diagnosis and also carries with it a high risk of complications and morbidity.    The differential diagnosis includes dental abscess, dental caries,    Initial Assessment:   Exam significant for lower right molar broken to the gumline with gingival  swelling and dental caries.  Tenderness to this tooth.  Concern for dental abscess.  Mild facial swelling on the lower right without erythema.   Disposition:   Augmentin sent to pharmacy to treat dental abscess.  Patient provided with dental resources.  The patient has been appropriately medically screened and/or stabilized in the ED. I have low suspicion for any other emergent medical condition which would require further screening, evaluation or treatment in the ED or require inpatient management. At time of discharge the patient is hemodynamically stable and in no acute distress. I have discussed work-up results and diagnosis with patient and answered all questions. Patient is agreeable with discharge plan. We discussed strict return precautions for returning to the emergency department and they verbalized understanding.    Social Determinants of Health:   Patient's  tobacco abuse, marijuana use  increases the complexity of managing their presentation         Final Clinical Impression(s) / ED Diagnoses Final diagnoses:  Dental abscess    Rx / DC Orders ED Discharge Orders          Ordered    amoxicillin-clavulanate (AUGMENTIN) 875-125 MG tablet  Every 12 hours        12/23/22 1631              Lenard Simmer, PA-C 12/23/22 1631    Rondel Baton, MD 12/26/22 1058

## 2022-12-23 NOTE — Discharge Instructions (Addendum)
Thank you for allowing [place] to be a part of your care today.  You were evaluated in the ED for a dental abscess.  I have sent in a prescription for antibiotics to the pharmacy.  Please schedule a follow up appointment with a dentist.  A list of dental services in the area has been provided for you.    I recommend using ice to the outside of your face 2-3 times per day for no longer than 20 minutes at a time to help with swelling.  Take 600-800 mg of ibuprofen every 6-8 hours.  DO NOT use heat to the face as this may make infection worse.   Return to the ED if you develop sudden worsening of your symptoms or if you have any new concerns.

## 2022-12-23 NOTE — ED Triage Notes (Signed)
Pt came in via POV d/t a dental abscess in the lower Rt side of his mouth. He noticed it there 3 days ago, denies fevers or drainage. Here requesting ABTs.
